# Patient Record
Sex: Female | Born: 2005 | Race: White | Hispanic: No | Marital: Single | Smoking: Never smoker
Health system: Southern US, Community
[De-identification: ages and names within clinical notes are randomized; demographics above are authoritative.]

## PROBLEM LIST (undated history)

## (undated) HISTORY — PX: CHOLECYSTECTOMY: SHX55

---
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
## 2024-05-16 ENCOUNTER — Encounter (HOSPITAL_COMMUNITY): Payer: Self-pay

## 2024-05-16 ENCOUNTER — Other Ambulatory Visit: Payer: Self-pay

## 2024-05-16 ENCOUNTER — Emergency Department (HOSPITAL_COMMUNITY)
Admission: EM | Admit: 2024-05-16 | Discharge: 2024-05-17 | Attending: Emergency Medicine | Admitting: Emergency Medicine

## 2024-05-16 DIAGNOSIS — Z5321 Procedure and treatment not carried out due to patient leaving prior to being seen by health care provider: Secondary | ICD-10-CM | POA: Diagnosis not present

## 2024-05-16 DIAGNOSIS — R509 Fever, unspecified: Secondary | ICD-10-CM | POA: Diagnosis present

## 2024-05-16 DIAGNOSIS — J101 Influenza due to other identified influenza virus with other respiratory manifestations: Secondary | ICD-10-CM | POA: Diagnosis not present

## 2024-05-16 LAB — RESP PANEL BY RT-PCR (RSV, FLU A&B, COVID)  RVPGX2
Influenza A by PCR: POSITIVE — AB
Influenza B by PCR: NEGATIVE
Resp Syncytial Virus by PCR: NEGATIVE
SARS Coronavirus 2 by RT PCR: NEGATIVE

## 2024-05-16 NOTE — ED Triage Notes (Addendum)
 She states she woke up this morning feeling fine and now she has a fever, bodyaches, and cough that have developed through out the day.

## 2024-05-17 NOTE — ED Notes (Signed)
 Called Pt 3xs got no answer. Taking her OTF
